# Patient Record
Sex: Female | Born: 1950 | Race: White | Hispanic: No | Smoking: Never smoker
Health system: Southern US, Community
[De-identification: ages and names within clinical notes are randomized; demographics above are authoritative.]

## PROBLEM LIST (undated history)

## (undated) DIAGNOSIS — R519 Headache, unspecified: Secondary | ICD-10-CM

## (undated) DIAGNOSIS — R51 Headache: Secondary | ICD-10-CM

## (undated) DIAGNOSIS — M797 Fibromyalgia: Secondary | ICD-10-CM

## (undated) DIAGNOSIS — I82409 Acute embolism and thrombosis of unspecified deep veins of unspecified lower extremity: Secondary | ICD-10-CM

## (undated) DIAGNOSIS — J309 Allergic rhinitis, unspecified: Secondary | ICD-10-CM

## (undated) DIAGNOSIS — E785 Hyperlipidemia, unspecified: Secondary | ICD-10-CM

## (undated) DIAGNOSIS — I2699 Other pulmonary embolism without acute cor pulmonale: Secondary | ICD-10-CM

## (undated) HISTORY — DX: Acute embolism and thrombosis of unspecified deep veins of unspecified lower extremity: I82.409

## (undated) HISTORY — PX: OTHER SURGICAL HISTORY: SHX169

## (undated) HISTORY — DX: Headache: R51

## (undated) HISTORY — DX: Allergic rhinitis, unspecified: J30.9

## (undated) HISTORY — DX: Other pulmonary embolism without acute cor pulmonale: I26.99

## (undated) HISTORY — DX: Headache, unspecified: R51.9

## (undated) HISTORY — DX: Hyperlipidemia, unspecified: E78.5

## (undated) HISTORY — DX: Fibromyalgia: M79.7

---

## 2016-02-11 DIAGNOSIS — H2513 Age-related nuclear cataract, bilateral: Secondary | ICD-10-CM | POA: Diagnosis not present

## 2016-02-11 DIAGNOSIS — H524 Presbyopia: Secondary | ICD-10-CM | POA: Diagnosis not present

## 2016-02-14 DIAGNOSIS — E785 Hyperlipidemia, unspecified: Secondary | ICD-10-CM | POA: Diagnosis not present

## 2016-02-14 DIAGNOSIS — Z9181 History of falling: Secondary | ICD-10-CM | POA: Diagnosis not present

## 2016-02-14 DIAGNOSIS — J019 Acute sinusitis, unspecified: Secondary | ICD-10-CM | POA: Diagnosis not present

## 2016-02-14 DIAGNOSIS — B359 Dermatophytosis, unspecified: Secondary | ICD-10-CM | POA: Diagnosis not present

## 2016-05-07 DIAGNOSIS — J329 Chronic sinusitis, unspecified: Secondary | ICD-10-CM | POA: Diagnosis not present

## 2016-07-14 DIAGNOSIS — J40 Bronchitis, not specified as acute or chronic: Secondary | ICD-10-CM | POA: Diagnosis not present

## 2016-07-14 DIAGNOSIS — Z6829 Body mass index (BMI) 29.0-29.9, adult: Secondary | ICD-10-CM | POA: Diagnosis not present

## 2016-10-16 DIAGNOSIS — M5416 Radiculopathy, lumbar region: Secondary | ICD-10-CM | POA: Diagnosis not present

## 2016-10-16 DIAGNOSIS — Z6828 Body mass index (BMI) 28.0-28.9, adult: Secondary | ICD-10-CM | POA: Diagnosis not present

## 2016-10-16 DIAGNOSIS — J329 Chronic sinusitis, unspecified: Secondary | ICD-10-CM | POA: Diagnosis not present

## 2016-10-17 DIAGNOSIS — M79604 Pain in right leg: Secondary | ICD-10-CM | POA: Diagnosis not present

## 2016-10-17 DIAGNOSIS — I82442 Acute embolism and thrombosis of left tibial vein: Secondary | ICD-10-CM | POA: Diagnosis not present

## 2016-10-17 DIAGNOSIS — S90511A Abrasion, right ankle, initial encounter: Secondary | ICD-10-CM | POA: Diagnosis not present

## 2016-10-17 DIAGNOSIS — R918 Other nonspecific abnormal finding of lung field: Secondary | ICD-10-CM | POA: Diagnosis not present

## 2016-10-17 DIAGNOSIS — Z79899 Other long term (current) drug therapy: Secondary | ICD-10-CM | POA: Diagnosis not present

## 2016-10-17 DIAGNOSIS — E785 Hyperlipidemia, unspecified: Secondary | ICD-10-CM | POA: Diagnosis not present

## 2016-10-17 DIAGNOSIS — I2699 Other pulmonary embolism without acute cor pulmonale: Secondary | ICD-10-CM | POA: Diagnosis not present

## 2016-10-17 DIAGNOSIS — L03115 Cellulitis of right lower limb: Secondary | ICD-10-CM | POA: Diagnosis not present

## 2016-10-17 DIAGNOSIS — M1990 Unspecified osteoarthritis, unspecified site: Secondary | ICD-10-CM | POA: Diagnosis not present

## 2016-10-17 DIAGNOSIS — I82401 Acute embolism and thrombosis of unspecified deep veins of right lower extremity: Secondary | ICD-10-CM | POA: Diagnosis not present

## 2016-10-17 DIAGNOSIS — M797 Fibromyalgia: Secondary | ICD-10-CM | POA: Diagnosis not present

## 2016-10-17 DIAGNOSIS — Z881 Allergy status to other antibiotic agents status: Secondary | ICD-10-CM | POA: Diagnosis not present

## 2016-10-17 DIAGNOSIS — Z882 Allergy status to sulfonamides status: Secondary | ICD-10-CM | POA: Diagnosis not present

## 2016-10-18 DIAGNOSIS — S90511A Abrasion, right ankle, initial encounter: Secondary | ICD-10-CM | POA: Diagnosis not present

## 2016-10-18 DIAGNOSIS — L03115 Cellulitis of right lower limb: Secondary | ICD-10-CM | POA: Diagnosis not present

## 2016-10-18 DIAGNOSIS — I82401 Acute embolism and thrombosis of unspecified deep veins of right lower extremity: Secondary | ICD-10-CM | POA: Diagnosis not present

## 2016-10-18 DIAGNOSIS — M1990 Unspecified osteoarthritis, unspecified site: Secondary | ICD-10-CM | POA: Diagnosis not present

## 2016-10-18 DIAGNOSIS — R918 Other nonspecific abnormal finding of lung field: Secondary | ICD-10-CM | POA: Diagnosis not present

## 2016-10-18 DIAGNOSIS — I2699 Other pulmonary embolism without acute cor pulmonale: Secondary | ICD-10-CM | POA: Diagnosis not present

## 2016-10-18 DIAGNOSIS — M79604 Pain in right leg: Secondary | ICD-10-CM | POA: Diagnosis not present

## 2016-10-19 DIAGNOSIS — L271 Localized skin eruption due to drugs and medicaments taken internally: Secondary | ICD-10-CM | POA: Diagnosis not present

## 2016-10-19 DIAGNOSIS — T3695XA Adverse effect of unspecified systemic antibiotic, initial encounter: Secondary | ICD-10-CM | POA: Diagnosis not present

## 2016-10-19 DIAGNOSIS — T50905A Adverse effect of unspecified drugs, medicaments and biological substances, initial encounter: Secondary | ICD-10-CM | POA: Diagnosis not present

## 2016-10-21 DIAGNOSIS — I82409 Acute embolism and thrombosis of unspecified deep veins of unspecified lower extremity: Secondary | ICD-10-CM | POA: Diagnosis not present

## 2016-10-21 DIAGNOSIS — I2699 Other pulmonary embolism without acute cor pulmonale: Secondary | ICD-10-CM | POA: Diagnosis not present

## 2016-10-21 DIAGNOSIS — M797 Fibromyalgia: Secondary | ICD-10-CM | POA: Diagnosis not present

## 2016-12-03 DIAGNOSIS — Z23 Encounter for immunization: Secondary | ICD-10-CM | POA: Diagnosis not present

## 2016-12-04 ENCOUNTER — Encounter: Payer: Self-pay | Admitting: Pulmonary Disease

## 2016-12-04 ENCOUNTER — Ambulatory Visit (INDEPENDENT_AMBULATORY_CARE_PROVIDER_SITE_OTHER): Payer: Medicare Other | Admitting: Pulmonary Disease

## 2016-12-04 DIAGNOSIS — R911 Solitary pulmonary nodule: Secondary | ICD-10-CM

## 2016-12-04 NOTE — Patient Instructions (Signed)
We will schedule you for a PET scan for further evaluation of the mass in the lung I will call you after the PET scan to discuss results and further steps as needed  Follow-up in 4 weeks.

## 2016-12-04 NOTE — Progress Notes (Signed)
Rhonda Lane    [id number]    08-Jun-1950  Primary Care Physician:Holt, Gara Kroner, MD  Referring Physician: Ronita Hipps, MD [place] [id number],  Chief complaint:  Consult for evaluation of pulmonary embolism, right lung mass  HPI: 66 year old with history of allergies, headaches, hyperlipidemia, fibromyalgia. She is evaluated at [facility] ED in mid August 2018 for right leg swelling. She is noted to have right leg DVT and bilateral pulmonary embolism. There is no evidence of right heart strain on CT or echocardiogram done at [facility] as per the patient. She is currently on Xarelto anticoagulation. There were no precipitating factors, no travel, immobility. There is no family history of clots.  CT scan of the chest also shows a right lower lobe mass like opacity with mediastinal lymphadenopathy. She has been referred here for further evaluation.  Occupation: Retired Building services engineer Smoking history: Never smoker. Exposed to secondhand smoke Travel History: Trip to [place] in early 2018. No recent travel.  Outpatient Encounter Prescriptions as of 12/04/2016  Medication Sig  . butalbital-acetaminophen-caffeine (FIORICET, ESGIC) 50-325-40 MG tablet TAKE 1 TABLET EVERY 6 HOURS  . cevimeline (EVOXAC) 30 MG capsule Take 30 mg by mouth 3 (three) times daily.  . cyclobenzaprine (FLEXERIL) 10 MG tablet Take 10 mg by mouth 3 (three) times daily as needed for muscle spasms.  Marland Kitchen donepezil (ARICEPT) 5 MG tablet Take 5 mg by mouth at bedtime.  . DULoxetine (CYMBALTA) 60 MG capsule Take 60 mg by mouth daily.  . ergocalciferol (VITAMIN D2) 50000 units capsule Take 50,000 Units by mouth once a week.  Marland Kitchen omeprazole (PRILOSEC) 20 MG capsule Take 20 mg by mouth daily.  . rosuvastatin (CRESTOR) 10 MG tablet Take 10 mg by mouth daily.  . SUPRAX 400 MG CAPS capsule Take 400 mg by mouth daily.  Alveda Reasons STARTER PACK 15 & 20 MG TBPK See admin instructions.   No  facility-administered encounter medications on file as of 12/04/2016.     Allergies as of 12/04/2016 - Review Complete 12/04/2016  Allergen Reaction Noted  . Sulfa antibiotics  12/04/2016  . Erythromycin Rash 12/04/2016  . Macrodantin [nitrofurantoin macrocrystal] Rash 12/04/2016    Past Medical History:  Diagnosis Date  . Allergic rhinitis   . Chronic headache   . DVT (deep venous thrombosis) ([place])   . Fibromyalgia   . Hyperlipidemia   . Pulmonary embolism [facility])     Past Surgical History:  Procedure Laterality Date  . DNC      Family History  Problem Relation Age of Onset  . Heart disease Mother   . Stroke Father   . Throat cancer Paternal Grandfather     Social History   Social History  . Marital status: Widowed    Spouse name: N/A  . Number of children: N/A  . Years of education: N/A   Occupational History  . Not on file.   Social History Main Topics  . Smoking status: Never Smoker  . Smokeless tobacco: Never Used  . Alcohol use No  . Drug use: No  . Sexual activity: Not on file   Other Topics Concern  . Not on file   Social History Narrative  . No narrative on file   Review of systems: Review of Systems  Constitutional: Negative for fever and chills.  HENT: Negative.   Eyes: Negative for blurred vision.  Respiratory: as per HPI  Cardiovascular: Negative for chest pain and palpitations.  Gastrointestinal:  Negative for vomiting, diarrhea, blood per rectum. Genitourinary: Negative for dysuria, urgency, frequency and hematuria.  Musculoskeletal: Negative for myalgias, back pain and joint pain.  Skin: Negative for itching and rash.  Neurological: Negative for dizziness, tremors, focal weakness, seizures and loss of consciousness.  Endo/Heme/Allergies: Negative for environmental allergies.  Psychiatric/Behavioral: Negative for depression, suicidal ideas and hallucinations.  All other systems reviewed and are negative.  Physical Exam: Blood  pressure 126/72, pulse 76, height 5\' 3"  (1.6 m), weight 73.9 kg (163 lb), SpO2 97 %. Gen:      No acute distress HEENT:  EOMI, sclera anicteric Neck:     No masses; no thyromegaly Lungs:    Clear to auscultation bilaterally; normal respiratory effort CV:         Regular rate and rhythm; no murmurs Abd:      + bowel sounds; soft, non-tender; no palpable masses, no distension Ext:    No edema; adequate peripheral perfusion Skin:      Warm and dry; no rash Neuro: alert and oriented x 3 Psych: normal mood and affect  Data Reviewed: CT angiogram of the chest 10/17/16-bilateral lobar and segmental pulmonary embolism, 4 cm right lower lobe masslike opacity, left hilar, subcarinal, pretracheal lymphadenopathy. I reviewed the images personally Lower extremity Doppler 10/17/16-right DVT  Assessment:  Bilateral pulmonary embolism, DVT This is apparently unprovoked. She is currently on Xarelto anticoagulation and may need indefinite therapy. No evidence of RV strain on echocardiogram done at [facility].  Right lung mass with mediastinal lymphadenopathy Could be an infarct but the appearance is concerning for malignancy. Even though she is a nonsmoker she had been exposed to significant secondhand smoke. We will schedule for PET scan for further evaluation We will decide on the need and the best approach to biopsy based on the results of the PET scan  More then 1/2 the time of the 40 min visit was spent in counseling and/or coordination of care with the patient and family.  Plan/Recommendations: - PET/CT of body  Marshell Garfinkel MD  Pulmonary and Critical Care Pager [phone] 12/04/2016, 11:29 AM  CC: Ronita Hipps, MD

## 2016-12-12 ENCOUNTER — Ambulatory Visit (HOSPITAL_COMMUNITY)
Admission: RE | Admit: 2016-12-12 | Discharge: 2016-12-12 | Disposition: A | Discharge status: Home / Self Care | Payer: Medicare Other | Source: Ambulatory Visit | Attending: Pulmonary Disease | Admitting: Pulmonary Disease

## 2016-12-12 DIAGNOSIS — C782 Secondary malignant neoplasm of pleura: Secondary | ICD-10-CM | POA: Insufficient documentation

## 2016-12-12 DIAGNOSIS — C778 Secondary and unspecified malignant neoplasm of lymph nodes of multiple regions: Secondary | ICD-10-CM | POA: Diagnosis not present

## 2016-12-12 DIAGNOSIS — R911 Solitary pulmonary nodule: Secondary | ICD-10-CM | POA: Diagnosis not present

## 2016-12-12 DIAGNOSIS — R918 Other nonspecific abnormal finding of lung field: Secondary | ICD-10-CM | POA: Diagnosis not present

## 2016-12-12 LAB — GLUCOSE, CAPILLARY: Glucose-Capillary: 105 mg/dL — ABNORMAL HIGH (ref 65–99)

## 2016-12-12 IMAGING — PT NM PET TUM IMG INITIAL (PI) SKULL BASE T - THIGH
1 series · 9 of 9 positions shown · non-contrast
Comparison: CT scan 10/17/2016

CLINICAL DATA: Initial treatment strategy for pulmonary nodules.

EXAM:
NUCLEAR MEDICINE PET SKULL BASE TO THIGH
TECHNIQUE: 8.9 mCi F-18 FDG was injected intravenously. Full-ring PET imaging
was performed from the skull base to thigh after the radiotracer. CT
data was obtained and used for attenuation correction and anatomic
localization.
FASTING BLOOD GLUCOSE:  Value: 105 mg/dl

[Series 1032: results mm oncology reading · 0.89mm/px · 9 of 9 slices shown]
[im 1/9]
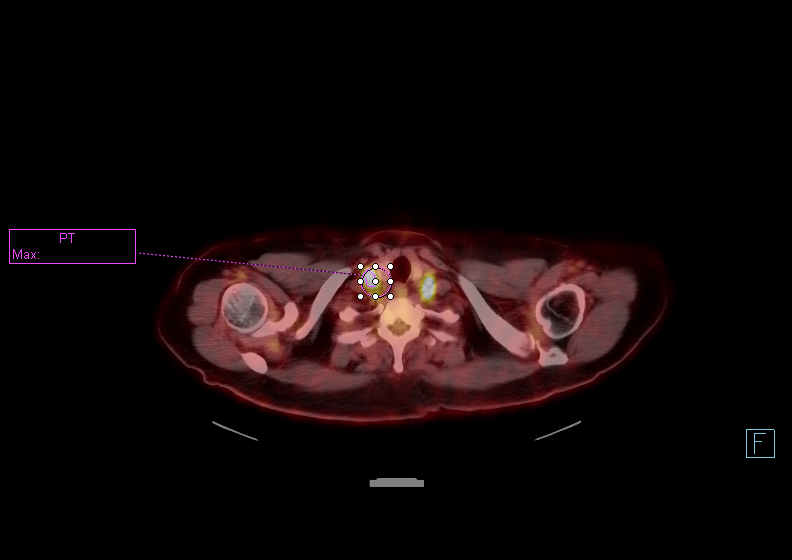
[im 2/9]
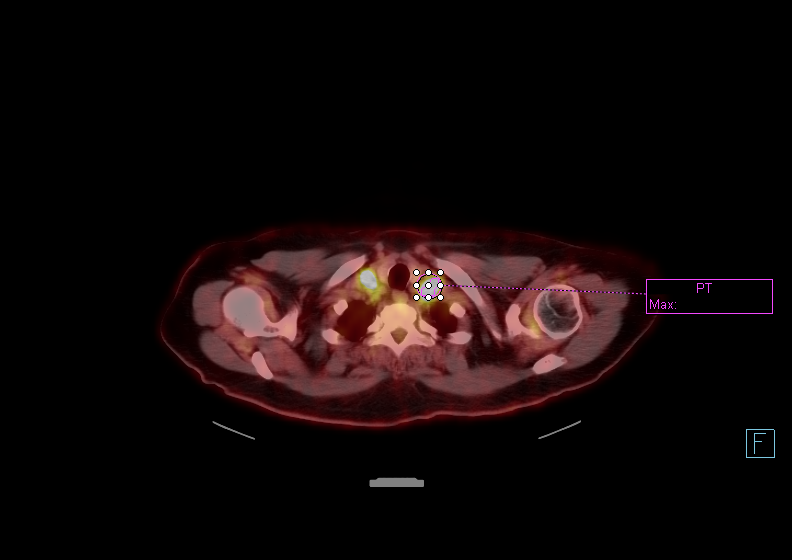
[im 3/9]
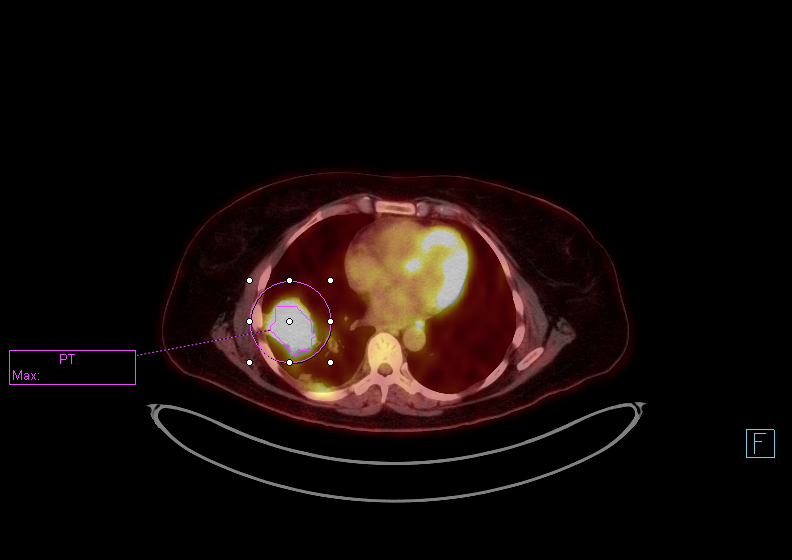
[im 4/9]
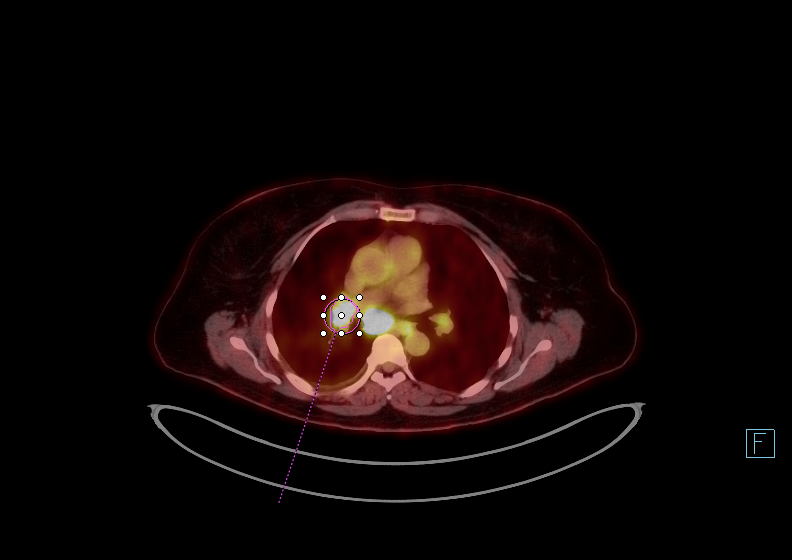
[im 5/9]
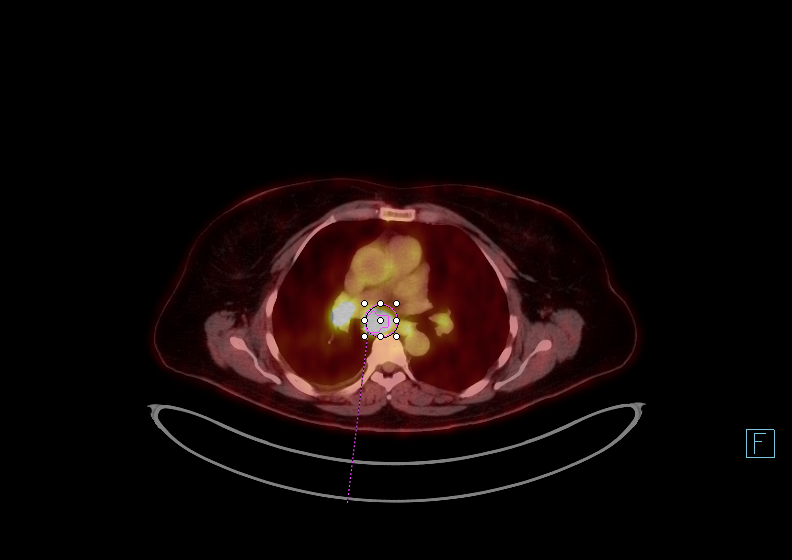
[im 6/9]
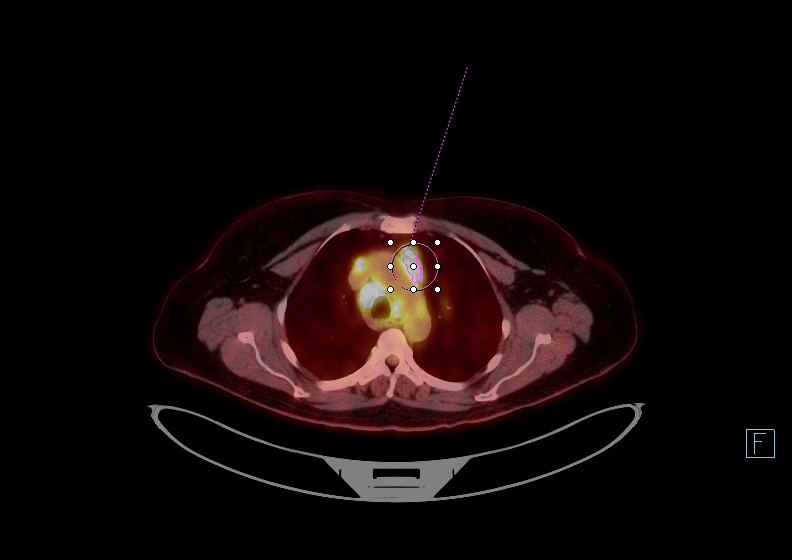
[im 7/9]
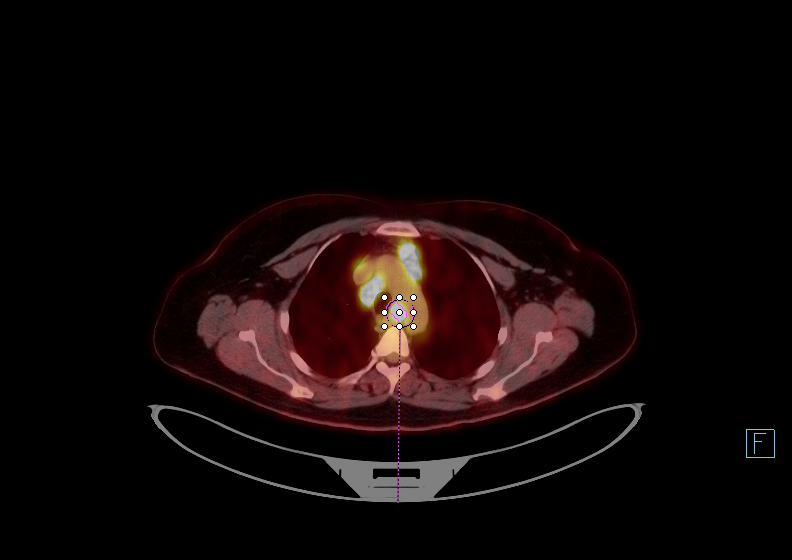
[im 8/9]
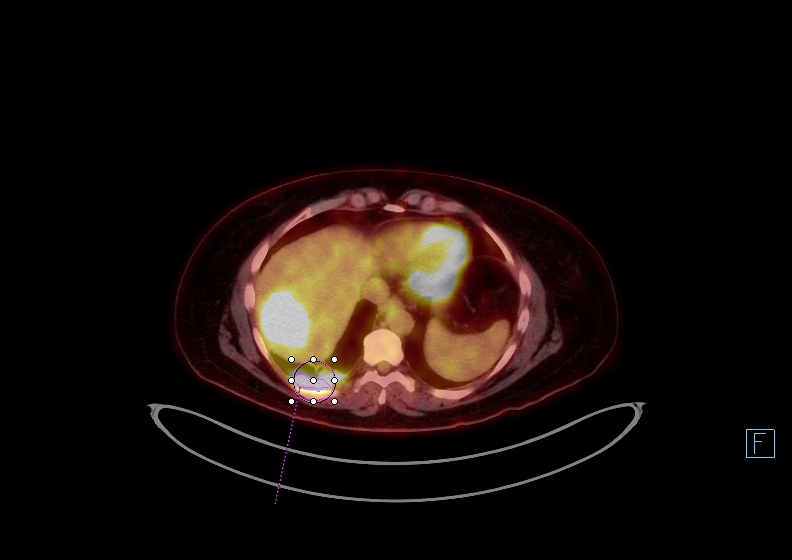
[im 9/9]
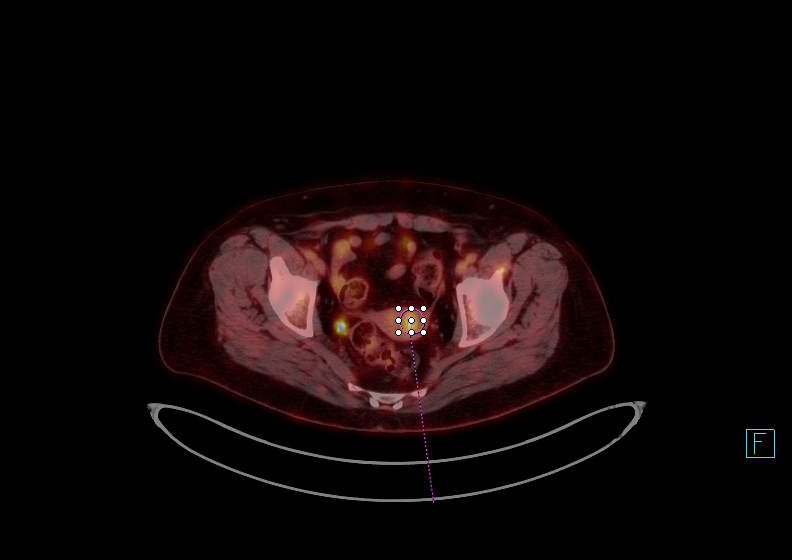

[9 of 9 positions shown; findings below may reference images not displayed]

FINDINGS: NECK: Bilateral hypermetabolic supraclavicular lymphadenopathy. 8 mm
right supraclavicular node on image number 35 has an SUV max of
11 mm node on image number 37 has an SUV max of 13.8. No neck mass
or upper neck adenopathy.

CHEST: Large right lower lobe lung mass measuring 4.5 cm has an SUV
max of

Bulky right hilar and diffuse mediastinal lymphadenopathy is
hypermetabolic.

Right hilar nodal mass has an SUV max of 8.5.

Right basilar pleural disease is hypermetabolic with SUV max of
and consistent with pleural metastasis. Subcarinal nodal disease has
an SUV max of

Prevascular adenopathy has an SUV max of

ABDOMEN/PELVIS: No abnormal hypermetabolic activity within the
liver, pancreas, adrenal glands, or spleen. No hypermetabolic lymph
nodes in the abdomen or pelvis.

The fused PET images make it would. There is a large lesion in the
dome of the liver but this is misregistration with the right lower
lobe lung mass, due to eventration of the right hemidiaphragm.

SKELETON: No focal hypermetabolic activity to suggest skeletal
metastasis.
IMPRESSION: 1. Hypermetabolic right lower lobe lung mass consistent with primary
lung neoplasm.
2. Right hilar and ipsilateral and contralateral mediastinal
metastatic adenopathy.
3. Right pleural metastatic disease.
4. No definite metastatic pulmonary nodules.
5. Bilateral supraclavicular metastatic adenopathy.
6. No abdominal/pelvic or osseous metastatic disease.

## 2016-12-12 MED ORDER — FLUDEOXYGLUCOSE F - 18 (FDG) INJECTION
8.9000 | Freq: Once | INTRAVENOUS | Status: AC | PRN
  Administered 2016-12-12: 8.9 via INTRAVENOUS

## 2016-12-16 ENCOUNTER — Other Ambulatory Visit: Payer: Self-pay | Admitting: Pulmonary Disease

## 2016-12-16 DIAGNOSIS — R590 Localized enlarged lymph nodes: Secondary | ICD-10-CM

## 2016-12-16 DIAGNOSIS — R918 Other nonspecific abnormal finding of lung field: Secondary | ICD-10-CM

## 2016-12-16 NOTE — Progress Notes (Addendum)
US Guided Biopsy per PM Parke Poisson, CMA 12/16/16

## 2016-12-16 NOTE — Addendum Note (Signed)
Addended by: Parke Poisson E on: 12/16/2016 02:31 PM   Modules accepted: Orders

## 2016-12-17 ENCOUNTER — Telehealth: Payer: Self-pay | Admitting: Pulmonary Disease

## 2016-12-17 ENCOUNTER — Other Ambulatory Visit: Payer: Self-pay

## 2016-12-17 DIAGNOSIS — R590 Localized enlarged lymph nodes: Secondary | ICD-10-CM

## 2016-12-17 DIAGNOSIS — R918 Other nonspecific abnormal finding of lung field: Secondary | ICD-10-CM

## 2016-12-17 NOTE — Telephone Encounter (Signed)
Spoke with patient. Advised her that the correct order had to placed and that radiology would call her to get that scheduled.   She verbalized understanding. Nothing else needed at time of call.

## 2016-12-17 NOTE — Telephone Encounter (Signed)
Per patient's chart, this has already been changed. Will close this message.

## 2016-12-26 ENCOUNTER — Other Ambulatory Visit: Payer: Self-pay | Admitting: Radiology

## 2016-12-29 ENCOUNTER — Encounter (HOSPITAL_COMMUNITY): Payer: Self-pay | Admitting: *Deleted

## 2016-12-29 ENCOUNTER — Ambulatory Visit (HOSPITAL_COMMUNITY)
Admission: RE | Admit: 2016-12-29 | Discharge: 2016-12-29 | Disposition: A | Discharge status: Home / Self Care | Payer: Medicare Other | Source: Ambulatory Visit | Attending: Pulmonary Disease | Admitting: Pulmonary Disease

## 2016-12-29 DIAGNOSIS — Z86711 Personal history of pulmonary embolism: Secondary | ICD-10-CM | POA: Diagnosis not present

## 2016-12-29 DIAGNOSIS — C770 Secondary and unspecified malignant neoplasm of lymph nodes of head, face and neck: Secondary | ICD-10-CM | POA: Insufficient documentation

## 2016-12-29 DIAGNOSIS — Z86718 Personal history of other venous thrombosis and embolism: Secondary | ICD-10-CM | POA: Diagnosis not present

## 2016-12-29 DIAGNOSIS — R590 Localized enlarged lymph nodes: Secondary | ICD-10-CM

## 2016-12-29 DIAGNOSIS — C779 Secondary and unspecified malignant neoplasm of lymph node, unspecified: Secondary | ICD-10-CM | POA: Diagnosis not present

## 2016-12-29 DIAGNOSIS — E785 Hyperlipidemia, unspecified: Secondary | ICD-10-CM | POA: Diagnosis not present

## 2016-12-29 DIAGNOSIS — R591 Generalized enlarged lymph nodes: Secondary | ICD-10-CM | POA: Diagnosis present

## 2016-12-29 DIAGNOSIS — M797 Fibromyalgia: Secondary | ICD-10-CM | POA: Diagnosis not present

## 2016-12-29 DIAGNOSIS — R918 Other nonspecific abnormal finding of lung field: Secondary | ICD-10-CM | POA: Insufficient documentation

## 2016-12-29 DIAGNOSIS — C801 Malignant (primary) neoplasm, unspecified: Secondary | ICD-10-CM | POA: Diagnosis not present

## 2016-12-29 DIAGNOSIS — R911 Solitary pulmonary nodule: Secondary | ICD-10-CM | POA: Diagnosis not present

## 2016-12-29 LAB — PROTIME-INR
INR: 1.14
Prothrombin Time: 14.5 seconds (ref 11.4–15.2)

## 2016-12-29 LAB — CBC
HEMATOCRIT: 38.7 % (ref 36.0–46.0)
HEMOGLOBIN: 12.8 g/dL (ref 12.0–15.0)
MCH: 29.6 pg (ref 26.0–34.0)
MCHC: 33.1 g/dL (ref 30.0–36.0)
MCV: 89.4 fL (ref 78.0–100.0)
Platelets: 167 10*3/uL (ref 150–400)
RBC: 4.33 MIL/uL (ref 3.87–5.11)
RDW: 14.2 % (ref 11.5–15.5)
WBC: 6.2 10*3/uL (ref 4.0–10.5)

## 2016-12-29 LAB — APTT: aPTT: 28 seconds (ref 24–36)

## 2016-12-29 MED ORDER — MIDAZOLAM HCL 2 MG/2ML IJ SOLN
INTRAMUSCULAR | Status: AC
  Filled 2016-12-29: qty 2

## 2016-12-29 MED ORDER — GELATIN ABSORBABLE 12-7 MM EX MISC
CUTANEOUS | Status: AC
  Filled 2016-12-29: qty 1

## 2016-12-29 MED ORDER — SODIUM CHLORIDE 0.9 % IV SOLN
INTRAVENOUS | Status: DC
  Administered 2016-12-29: 12:00:00 via INTRAVENOUS

## 2016-12-29 MED ORDER — FENTANYL CITRATE (PF) 100 MCG/2ML IJ SOLN
INTRAMUSCULAR | Status: AC | PRN
  Administered 2016-12-29: 50 ug via INTRAVENOUS

## 2016-12-29 MED ORDER — MIDAZOLAM HCL 2 MG/2ML IJ SOLN
INTRAMUSCULAR | Status: AC | PRN
  Administered 2016-12-29: 1 mg via INTRAVENOUS

## 2016-12-29 MED ORDER — FENTANYL CITRATE (PF) 100 MCG/2ML IJ SOLN
INTRAMUSCULAR | Status: AC
  Filled 2016-12-29: qty 2

## 2016-12-29 MED ORDER — LIDOCAINE HCL (PF) 1 % IJ SOLN
INTRAMUSCULAR | Status: DC
Start: 2016-12-29 — End: 2016-12-30
  Filled 2016-12-29: qty 30

## 2016-12-29 NOTE — Procedures (Signed)
Interventional Radiology Procedure Note  Procedure: US guided right supraclavicular node bx.  .  Complications: None Recommendations:  - Ok to shower tomorrow - Do not submerge for 7 days - Routine wound care  - follow up pathology  Signed,  Dulcy Fanny. Earleen Newport, DO

## 2016-12-29 NOTE — Discharge Instructions (Signed)

## 2016-12-29 NOTE — H&P (Signed)
Chief Complaint: Right lower lobe lung mass with mediastinal and supraclavicular lymphadenopathy  Referring Physician:Dr. Marshell Garfinkel  Supervising Physician: Corrie Mckusick  Patient Status: [facility] - Out-pt  HPI: Rhonda Lane is a 66 y.o. female with a history of fibromyalgia, and right lower extremity DVT and bilateral pulmonary emboli found in August 2018 at [facility] emergency department.  On that CT scan she was also noted to have a right lower lobe mass with mediastinal lymphadenopathy.  She has been evaluated by a pulmonology doctor.  A PET scan has been obtained.  This revealed a hypermetabolic right lower lobe lung mass consistent with primary lung neoplasm as well as mediastinal adenopathy, right pleural metastatic disease, bilateral supraclavicular metastatic adenopathy.  The patient presents today for a biopsy of a supraclavicular lymph node for diagnosis.  Past Medical History:  Past Medical History:  Diagnosis Date  . Allergic rhinitis   . Chronic headache   . DVT (deep venous thrombosis) ([place])   . Fibromyalgia   . Hyperlipidemia   . Pulmonary embolism [facility])     Past Surgical History:  Past Surgical History:  Procedure Laterality Date  . DNC      Family History:  Family History  Problem Relation Age of Onset  . Heart disease Mother   . Stroke Father   . Throat cancer Paternal Grandfather     Social History:  reports that she has never smoked. She has never used smokeless tobacco. She reports that she does not drink alcohol or use drugs.  Allergies:  Allergies  Allergen Reactions  . Simvastatin Other (See Comments)    Depression  . Sulfa Antibiotics Other (See Comments)    Unknown  . Tizanidine Other (See Comments)    BP too low  . Erythromycin Rash  . Macrodantin [Nitrofurantoin Macrocrystal] Rash    Medications: Medications reviewed in epic  Please HPI for pertinent positives, otherwise complete 10 system ROS negative, except some right lower  chest wall pain with deep inspiration.  Mallampati Score: MD Evaluation Airway: WNL Heart: WNL Abdomen: WNL Chest/ Lungs: WNL ASA  Classification: 3 Mallampati/Airway Score: Two  Physical Exam: BP (!) 152/80   Pulse 88   Temp 97.7 F (36.5 C) (Oral)   Ht 5\' 3"  (1.6 m)   Wt 160 lb (72.6 kg)   SpO2 100%   BMI 28.34 kg/m  Body mass index is 28.34 kg/m. General: pleasant, WD, WN white female who is laying in bed in NAD HEENT: head is normocephalic, atraumatic.  Sclera are noninjected.  PERRL.  Ears and nose without any masses or lesions.  Mouth is pink and moist Heart: regular, rate, and rhythm.  Normal s1,s2. No obvious murmurs, gallops, or rubs noted.  Palpable radial and pedal pulses bilaterally Lungs: CTAB, no wheezes, rhonchi, or rales noted.  Respiratory effort nonlabored Abd: soft, NT, ND, +BS, no masses, hernias, or organomegaly Psych: A&Ox3 with an appropriate affect.   Labs: Results for orders placed or performed during the hospital encounter of 12/29/16 (from the past 48 hour(s))  APTT upon arrival     Status: None   Collection Time: 12/29/16 12:00 PM  Result Value Ref Range   aPTT 28 24 - 36 seconds  CBC upon arrival     Status: None   Collection Time: 12/29/16 12:00 PM  Result Value Ref Range   WBC 6.2 4.0 - 10.5 K/uL   RBC 4.33 3.87 - 5.11 MIL/uL   Hemoglobin 12.8 12.0 - 15.0 g/dL  HCT 38.7 36.0 - 46.0 %   MCV 89.4 78.0 - 100.0 fL   MCH 29.6 26.0 - 34.0 pg   MCHC 33.1 30.0 - 36.0 g/dL   RDW 14.2 11.5 - 15.5 %   Platelets 167 150 - 400 K/uL  Protime-INR upon arrival     Status: None   Collection Time: 12/29/16 12:00 PM  Result Value Ref Range   Prothrombin Time 14.5 11.4 - 15.2 seconds   INR 1.14     Imaging: No results found.  Assessment/Plan 1.  Bilateral supraclavicular lymphadenopathy  We will proceed with a supraclavicular lymph node biopsy using ultrasound guidance.  The patient has had her Xarelto held for over 24 hours.  Her labs and  vital signs have all been reviewed. Risks and benefits discussed with the patient including, but not limited to bleeding, infection, damage to adjacent structures or low yield requiring additional tests. All of the patient's questions were answered, patient is agreeable to proceed. Consent signed and in chart.    Thank you for this interesting consult.  I greatly enjoyed meeting Rhonda Lane and look forward to participating in their care.  A copy of this report was sent to the requesting provider on this date.  Electronically Signed: Henreitta Cea 12/29/2016, 12:53 PM   I spent a total of  30 Minutes   in face to face in clinical consultation, greater than 50% of which was counseling/coordinating care for supraclavicular lymphadenopathy

## 2016-12-29 NOTE — Sedation Documentation (Signed)
Patient is resting comfortably. 

## 2016-12-31 ENCOUNTER — Other Ambulatory Visit: Payer: Self-pay

## 2016-12-31 ENCOUNTER — Telehealth: Payer: Self-pay | Admitting: Pulmonary Disease

## 2016-12-31 DIAGNOSIS — C3491 Malignant neoplasm of unspecified part of right bronchus or lung: Secondary | ICD-10-CM

## 2016-12-31 NOTE — Telephone Encounter (Signed)
Spoke with pt, she states she would like to know what type of cancer she has, hodkins, leukemia? She states she has an appt with PM tomorrow so I suggested she talk to him in detail tomorrow at the [place]. Pt agreed and nothing further is needed.

## 2017-01-01 ENCOUNTER — Encounter: Payer: Self-pay | Admitting: Pulmonary Disease

## 2017-01-01 ENCOUNTER — Ambulatory Visit (INDEPENDENT_AMBULATORY_CARE_PROVIDER_SITE_OTHER): Payer: Medicare Other | Admitting: Pulmonary Disease

## 2017-01-01 ENCOUNTER — Ambulatory Visit: Admitting: Pulmonary Disease

## 2017-01-01 VITALS — BP 136/78 | HR 99 | Ht 63.0 in | Wt 160.2 lb

## 2017-01-01 DIAGNOSIS — C3490 Malignant neoplasm of unspecified part of unspecified bronchus or lung: Secondary | ICD-10-CM | POA: Insufficient documentation

## 2017-01-01 NOTE — Progress Notes (Addendum)
Rhonda Lane    [id number]    Apr 10, 1950  Primary Care Physician:Holt, Gara Kroner, MD  Referring Physician: Ronita Hipps, MD [place] [address],  Chief complaint:  Follow up for Lung cancer, metastatic adenocarcinoma Pulmonary embolism, DVT  HPI: 66 year old with history of allergies, headaches, hyperlipidemia, fibromyalgia. She is evaluated at [facility] ED in mid August 2018 for right leg swelling. She is noted to have right leg DVT and bilateral pulmonary embolism. There is no evidence of right heart strain on CT or echocardiogram done at [facility] as per the patient. She is currently on Xarelto anticoagulation. There were no precipitating factors, no travel, immobility. There is no family history of clots.  CT scan of the chest also shows a right lower lobe mass like opacity with mediastinal lymphadenopathy. She has been referred here for further evaluation.  Occupation: Retired Building services engineer Smoking history: Never smoker. Exposed to secondhand smoke Travel History: Trip to [place] in early 2018. No recent travel.  Interim history: She underwent lymph node biopsy which shows metastatic adenocarcinoma.  She has developed a rash around the area of the biopsy site likely secondary to chlorhexidine.  She is treating this with cortisone ointment.  Outpatient Encounter Prescriptions as of 01/01/2017  Medication Sig  . butalbital-acetaminophen-caffeine (FIORICET, ESGIC) 50-325-40 MG tablet Take 2 tablet by mouth every 6 hours as needed for headaches  . Calcium Carb-Cholecalciferol (CALCIUM 600+D) 600-800 MG-UNIT TABS Take 1 tablet by mouth at bedtime.  . cevimeline (EVOXAC) 30 MG capsule Take 30 mg by mouth 3 (three) times daily.  . Cholecalciferol (HM VITAMIN D3) 2000 units CAPS Take 2,000 Units by mouth at bedtime.  . cyclobenzaprine (FLEXERIL) 10 MG tablet Take 10-20 mg by mouth See admin instructions. Take 10 mg by mouth in the morning and take 20 mg  by mouth at bedtime  . donepezil (ARICEPT) 5 MG tablet Take 5 mg by mouth daily.   . DULoxetine (CYMBALTA) 60 MG capsule Take 60 mg by mouth 2 (two) times daily.   . ergocalciferol (VITAMIN D2) 50000 units capsule Take 50,000 Units by mouth every 14 (fourteen) days.   . magnesium gluconate (MAGONATE) 500 MG tablet Take 500 mg by mouth at bedtime.  . Multiple Vitamins-Minerals (MULTIVITAMIN PO) Take 1 tablet by mouth daily.  . Omega-3 Fatty Acids (OMEGA-3 FISH OIL PO) Take 1 capsule by mouth at bedtime.  Marland Kitchen omeprazole (PRILOSEC) 20 MG capsule Take 20 mg by mouth daily.  Vladimir Faster Glycol-Propyl Glycol (SYSTANE OP) Place 1 drop into both eyes See admin instructions. Place 1 drop in both eyes twice daily. Place 1 drop in both eyes as needed for dry eyes  . rivaroxaban (XARELTO) 20 MG TABS tablet Take 20 mg by mouth daily.   No facility-administered encounter medications on file as of 01/01/2017.     Allergies as of 01/01/2017 - Review Complete 12/29/2016  Allergen Reaction Noted  . Simvastatin Other (See Comments) 12/22/2016  . Sulfa antibiotics Other (See Comments) 12/04/2016  . Tizanidine Other (See Comments) 12/22/2016  . Erythromycin Rash 12/04/2016  . Macrodantin [nitrofurantoin macrocrystal] Rash 12/04/2016    Past Medical History:  Diagnosis Date  . Allergic rhinitis   . Chronic headache   . DVT (deep venous thrombosis) ([place])   . Fibromyalgia   . Hyperlipidemia   . Pulmonary embolism [facility])     Past Surgical History:  Procedure Laterality Date  . [facility]      Family  History  Problem Relation Age of Onset  . Heart disease Mother   . Stroke Father   . Throat cancer Paternal Grandfather     Social History   Social History  . Marital status: Widowed    Spouse name: N/A  . Number of children: N/A  . Years of education: N/A   Occupational History  . Not on file.   Social History Main Topics  . Smoking status: Never Smoker  . Smokeless tobacco: Never Used  . Alcohol  use No  . Drug use: No  . Sexual activity: Not on file   Other Topics Concern  . Not on file   Social History Narrative  . No narrative on file   Review of systems: Review of Systems  Constitutional: Negative for fever and chills.  HENT: Negative.   Eyes: Negative for blurred vision.  Respiratory: as per HPI  Cardiovascular: Negative for chest pain and palpitations.  Gastrointestinal: Negative for vomiting, diarrhea, blood per rectum. Genitourinary: Negative for dysuria, urgency, frequency and hematuria.  Musculoskeletal: Negative for myalgias, back pain and joint pain.  Skin: Negative for itching and rash.  Neurological: Negative for dizziness, tremors, focal weakness, seizures and loss of consciousness.  Endo/Heme/Allergies: Negative for environmental allergies.  Psychiatric/Behavioral: Negative for depression, suicidal ideas and hallucinations.  All other systems reviewed and are negative.  Physical Exam: Blood pressure 136/78, pulse 99, height 5\' 3"  (1.6 m), weight 160 lb 3.2 oz (72.7 kg), SpO2 98 %. Gen:      No acute distress HEENT:  EOMI, sclera anicteric Neck:     No masses; no thyromegaly Lungs:    Clear to auscultation bilaterally; normal respiratory effort CV:         Regular rate and rhythm; no murmurs Abd:      + bowel sounds; soft, non-tender; no palpable masses, no distension Ext:    No edema; adequate peripheral perfusion Skin:      Warm and dry; no rash Neuro: alert and oriented x 3 Psych: normal mood and affect  Data Reviewed: CT angiogram of the chest 10/17/16-bilateral lobar and segmental pulmonary embolism, 4 cm right lower lobe masslike opacity, left hilar, subcarinal, pretracheal lymphadenopathy. I reviewed the images personally Lower extremity Doppler 10/17/16-right DVT PET scan 12/12/16-FDG avid right lower lobe lung mass with involvement of the pleura, mediastinal lymphadenopathy, bilateral supraclavicular lymphadenopathy. Have reviewed all images  personally  Supraclavicular lymph node biopsy 12/29/16-adenocarcinoma  Assessment:  Metastatic adenocarcinoma PET scan reviewed which showed avid lung mass with mediastinal and supraclavicular lymphadenopathy.  She underwent a lymph node biopsy which confirms adenocarcinoma. Discussed results with patient and her husband today and answered all questions regarding her diagnosis and further steps She is requesting a referral to Dr. Hinton Rao, oncologist at [facility].  Bilateral pulmonary embolism, DVT On Xarelto.  She will need indefinite anticoagulation.  No evidence of RV strain on echocardiogram done at [facility].  Rash secondary to chlorhexidine Continue cortisone ointment, Eucerin cream. Use Benadryl over-the-counter. .  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 Started on palliative  chemotherapy with pembrolizumab She is having increasing cough with concern for immune mediated pneumonitis secondary to chemotherapy versus infection  Pembrolizimab was held and she was given a course of levofloxacin.   Marshell Garfinkel MD [place] Pulmonary and Critical Care Pager [phone] If no answer or after 3pm call: [phone] 04/01/2017, 9:11 AM

## 2017-01-01 NOTE — Patient Instructions (Signed)
We have discussed results of the biopsy which shows adenocarcinoma Referral has been placed to Dr. Hinton Rao, oncologist at [place] in 6 months.

## 2017-01-02 ENCOUNTER — Ambulatory Visit: Admitting: Pulmonary Disease

## 2017-01-05 DIAGNOSIS — L309 Dermatitis, unspecified: Secondary | ICD-10-CM | POA: Diagnosis not present

## 2017-01-05 DIAGNOSIS — L299 Pruritus, unspecified: Secondary | ICD-10-CM | POA: Diagnosis not present

## 2017-01-05 DIAGNOSIS — L820 Inflamed seborrheic keratosis: Secondary | ICD-10-CM | POA: Diagnosis not present

## 2017-01-07 DIAGNOSIS — C770 Secondary and unspecified malignant neoplasm of lymph nodes of head, face and neck: Secondary | ICD-10-CM | POA: Diagnosis not present

## 2017-01-07 DIAGNOSIS — Z86711 Personal history of pulmonary embolism: Secondary | ICD-10-CM | POA: Diagnosis not present

## 2017-01-07 DIAGNOSIS — Z86718 Personal history of other venous thrombosis and embolism: Secondary | ICD-10-CM | POA: Diagnosis not present

## 2017-01-07 DIAGNOSIS — C3491 Malignant neoplasm of unspecified part of right bronchus or lung: Secondary | ICD-10-CM | POA: Diagnosis not present

## 2017-01-07 DIAGNOSIS — Z7901 Long term (current) use of anticoagulants: Secondary | ICD-10-CM | POA: Diagnosis not present

## 2017-01-07 DIAGNOSIS — M797 Fibromyalgia: Secondary | ICD-10-CM | POA: Diagnosis not present

## 2017-01-07 DIAGNOSIS — C3431 Malignant neoplasm of lower lobe, right bronchus or lung: Secondary | ICD-10-CM | POA: Diagnosis not present

## 2017-01-13 DIAGNOSIS — C3431 Malignant neoplasm of lower lobe, right bronchus or lung: Secondary | ICD-10-CM | POA: Diagnosis not present

## 2017-01-13 DIAGNOSIS — I639 Cerebral infarction, unspecified: Secondary | ICD-10-CM | POA: Diagnosis not present

## 2017-01-16 ENCOUNTER — Encounter (HOSPITAL_COMMUNITY): Payer: Self-pay

## 2017-01-16 DIAGNOSIS — D1801 Hemangioma of skin and subcutaneous tissue: Secondary | ICD-10-CM | POA: Diagnosis not present

## 2017-01-16 DIAGNOSIS — L814 Other melanin hyperpigmentation: Secondary | ICD-10-CM | POA: Diagnosis not present

## 2017-01-16 DIAGNOSIS — L820 Inflamed seborrheic keratosis: Secondary | ICD-10-CM | POA: Diagnosis not present

## 2017-01-16 DIAGNOSIS — L578 Other skin changes due to chronic exposure to nonionizing radiation: Secondary | ICD-10-CM | POA: Diagnosis not present

## 2017-01-21 ENCOUNTER — Encounter (HOSPITAL_COMMUNITY): Payer: Self-pay

## 2017-01-21 DIAGNOSIS — C770 Secondary and unspecified malignant neoplasm of lymph nodes of head, face and neck: Secondary | ICD-10-CM | POA: Diagnosis not present

## 2017-01-21 DIAGNOSIS — C3431 Malignant neoplasm of lower lobe, right bronchus or lung: Secondary | ICD-10-CM | POA: Diagnosis not present

## 2017-01-29 DIAGNOSIS — Z5111 Encounter for antineoplastic chemotherapy: Secondary | ICD-10-CM | POA: Diagnosis not present

## 2017-01-29 DIAGNOSIS — C3431 Malignant neoplasm of lower lobe, right bronchus or lung: Secondary | ICD-10-CM | POA: Diagnosis not present

## 2017-02-06 DIAGNOSIS — C3431 Malignant neoplasm of lower lobe, right bronchus or lung: Secondary | ICD-10-CM | POA: Diagnosis not present

## 2017-02-06 DIAGNOSIS — J329 Chronic sinusitis, unspecified: Secondary | ICD-10-CM | POA: Diagnosis not present

## 2017-02-06 DIAGNOSIS — C770 Secondary and unspecified malignant neoplasm of lymph nodes of head, face and neck: Secondary | ICD-10-CM | POA: Diagnosis not present

## 2017-02-13 DIAGNOSIS — R9389 Abnormal findings on diagnostic imaging of other specified body structures: Secondary | ICD-10-CM | POA: Diagnosis not present

## 2017-02-13 DIAGNOSIS — C3431 Malignant neoplasm of lower lobe, right bronchus or lung: Secondary | ICD-10-CM | POA: Diagnosis not present

## 2017-02-13 DIAGNOSIS — I359 Nonrheumatic aortic valve disorder, unspecified: Secondary | ICD-10-CM | POA: Diagnosis not present

## 2017-02-19 DIAGNOSIS — C3431 Malignant neoplasm of lower lobe, right bronchus or lung: Secondary | ICD-10-CM | POA: Diagnosis not present

## 2017-02-19 DIAGNOSIS — R05 Cough: Secondary | ICD-10-CM | POA: Diagnosis not present

## 2017-02-26 DIAGNOSIS — R05 Cough: Secondary | ICD-10-CM | POA: Diagnosis not present

## 2017-02-26 DIAGNOSIS — C3431 Malignant neoplasm of lower lobe, right bronchus or lung: Secondary | ICD-10-CM | POA: Diagnosis not present

## 2017-02-26 DIAGNOSIS — R0602 Shortness of breath: Secondary | ICD-10-CM | POA: Diagnosis not present

## 2017-02-26 DIAGNOSIS — D649 Anemia, unspecified: Secondary | ICD-10-CM | POA: Diagnosis not present

## 2017-03-05 DIAGNOSIS — R0602 Shortness of breath: Secondary | ICD-10-CM | POA: Diagnosis not present

## 2017-03-05 DIAGNOSIS — R05 Cough: Secondary | ICD-10-CM | POA: Diagnosis not present

## 2017-03-05 DIAGNOSIS — R100 Acute abdomen: Secondary | ICD-10-CM | POA: Diagnosis not present

## 2017-03-05 DIAGNOSIS — C3431 Malignant neoplasm of lower lobe, right bronchus or lung: Secondary | ICD-10-CM | POA: Diagnosis not present

## 2017-03-06 DIAGNOSIS — R918 Other nonspecific abnormal finding of lung field: Secondary | ICD-10-CM | POA: Diagnosis not present

## 2017-03-06 DIAGNOSIS — R0602 Shortness of breath: Secondary | ICD-10-CM | POA: Diagnosis not present

## 2017-03-06 DIAGNOSIS — R531 Weakness: Secondary | ICD-10-CM | POA: Diagnosis not present

## 2017-03-06 DIAGNOSIS — R109 Unspecified abdominal pain: Secondary | ICD-10-CM | POA: Diagnosis not present

## 2017-03-06 DIAGNOSIS — C3491 Malignant neoplasm of unspecified part of right bronchus or lung: Secondary | ICD-10-CM | POA: Diagnosis not present

## 2017-03-06 DIAGNOSIS — R51 Headache: Secondary | ICD-10-CM | POA: Diagnosis not present

## 2017-03-09 DIAGNOSIS — Z86711 Personal history of pulmonary embolism: Secondary | ICD-10-CM | POA: Diagnosis not present

## 2017-03-09 DIAGNOSIS — Z66 Do not resuscitate: Secondary | ICD-10-CM | POA: Diagnosis not present

## 2017-03-09 DIAGNOSIS — I639 Cerebral infarction, unspecified: Secondary | ICD-10-CM | POA: Diagnosis not present

## 2017-03-09 DIAGNOSIS — C3490 Malignant neoplasm of unspecified part of unspecified bronchus or lung: Secondary | ICD-10-CM | POA: Diagnosis not present

## 2017-03-09 DIAGNOSIS — I2609 Other pulmonary embolism with acute cor pulmonale: Secondary | ICD-10-CM | POA: Diagnosis present

## 2017-03-09 DIAGNOSIS — Z8719 Personal history of other diseases of the digestive system: Secondary | ICD-10-CM | POA: Diagnosis not present

## 2017-03-09 DIAGNOSIS — I2699 Other pulmonary embolism without acute cor pulmonale: Secondary | ICD-10-CM | POA: Diagnosis not present

## 2017-03-09 DIAGNOSIS — M7989 Other specified soft tissue disorders: Secondary | ICD-10-CM | POA: Diagnosis not present

## 2017-03-09 DIAGNOSIS — Q211 Atrial septal defect: Secondary | ICD-10-CM | POA: Diagnosis not present

## 2017-03-09 DIAGNOSIS — I340 Nonrheumatic mitral (valve) insufficiency: Secondary | ICD-10-CM | POA: Diagnosis not present

## 2017-03-09 DIAGNOSIS — K7589 Other specified inflammatory liver diseases: Secondary | ICD-10-CM | POA: Diagnosis not present

## 2017-03-09 DIAGNOSIS — R279 Unspecified lack of coordination: Secondary | ICD-10-CM | POA: Diagnosis not present

## 2017-03-09 DIAGNOSIS — Z7901 Long term (current) use of anticoagulants: Secondary | ICD-10-CM | POA: Diagnosis not present

## 2017-03-09 DIAGNOSIS — I6310 Cerebral infarction due to embolism of unspecified precerebral artery: Secondary | ICD-10-CM | POA: Diagnosis not present

## 2017-03-09 DIAGNOSIS — Z87898 Personal history of other specified conditions: Secondary | ICD-10-CM | POA: Diagnosis not present

## 2017-03-09 DIAGNOSIS — C3431 Malignant neoplasm of lower lobe, right bronchus or lung: Secondary | ICD-10-CM | POA: Diagnosis not present

## 2017-03-09 DIAGNOSIS — F419 Anxiety disorder, unspecified: Secondary | ICD-10-CM | POA: Diagnosis present

## 2017-03-09 DIAGNOSIS — Z8739 Personal history of other diseases of the musculoskeletal system and connective tissue: Secondary | ICD-10-CM | POA: Diagnosis not present

## 2017-03-09 DIAGNOSIS — I517 Cardiomegaly: Secondary | ICD-10-CM | POA: Diagnosis not present

## 2017-03-09 DIAGNOSIS — R443 Hallucinations, unspecified: Secondary | ICD-10-CM | POA: Diagnosis not present

## 2017-03-09 DIAGNOSIS — I341 Nonrheumatic mitral (valve) prolapse: Secondary | ICD-10-CM | POA: Diagnosis not present

## 2017-03-09 DIAGNOSIS — R945 Abnormal results of liver function studies: Secondary | ICD-10-CM | POA: Diagnosis not present

## 2017-03-09 DIAGNOSIS — R300 Dysuria: Secondary | ICD-10-CM | POA: Diagnosis present

## 2017-03-09 DIAGNOSIS — C3491 Malignant neoplasm of unspecified part of right bronchus or lung: Secondary | ICD-10-CM | POA: Diagnosis not present

## 2017-03-09 DIAGNOSIS — R930 Abnormal findings on diagnostic imaging of skull and head, not elsewhere classified: Secondary | ICD-10-CM | POA: Diagnosis not present

## 2017-03-09 DIAGNOSIS — I2690 Septic pulmonary embolism without acute cor pulmonale: Secondary | ICD-10-CM | POA: Diagnosis not present

## 2017-03-09 DIAGNOSIS — K219 Gastro-esophageal reflux disease without esophagitis: Secondary | ICD-10-CM | POA: Diagnosis present

## 2017-03-09 DIAGNOSIS — G8929 Other chronic pain: Secondary | ICD-10-CM | POA: Diagnosis present

## 2017-03-09 DIAGNOSIS — Z79899 Other long term (current) drug therapy: Secondary | ICD-10-CM | POA: Diagnosis not present

## 2017-03-09 DIAGNOSIS — M797 Fibromyalgia: Secondary | ICD-10-CM | POA: Diagnosis not present

## 2017-03-09 DIAGNOSIS — T451X5A Adverse effect of antineoplastic and immunosuppressive drugs, initial encounter: Secondary | ICD-10-CM | POA: Diagnosis present

## 2017-03-09 DIAGNOSIS — G9341 Metabolic encephalopathy: Secondary | ICD-10-CM | POA: Diagnosis not present

## 2017-03-09 DIAGNOSIS — D649 Anemia, unspecified: Secondary | ICD-10-CM | POA: Diagnosis not present

## 2017-03-09 DIAGNOSIS — Z7401 Bed confinement status: Secondary | ICD-10-CM | POA: Diagnosis not present

## 2017-03-09 DIAGNOSIS — Z86718 Personal history of other venous thrombosis and embolism: Secondary | ICD-10-CM | POA: Diagnosis not present

## 2017-03-09 DIAGNOSIS — R4182 Altered mental status, unspecified: Secondary | ICD-10-CM | POA: Diagnosis not present

## 2017-03-09 DIAGNOSIS — Z8679 Personal history of other diseases of the circulatory system: Secondary | ICD-10-CM | POA: Diagnosis not present

## 2017-03-09 DIAGNOSIS — R0902 Hypoxemia: Secondary | ICD-10-CM | POA: Diagnosis present

## 2017-03-09 DIAGNOSIS — Z8639 Personal history of other endocrine, nutritional and metabolic disease: Secondary | ICD-10-CM | POA: Diagnosis not present

## 2017-03-09 DIAGNOSIS — Z515 Encounter for palliative care: Secondary | ICD-10-CM | POA: Diagnosis not present

## 2017-03-10 DIAGNOSIS — K7589 Other specified inflammatory liver diseases: Secondary | ICD-10-CM

## 2017-03-10 DIAGNOSIS — I341 Nonrheumatic mitral (valve) prolapse: Secondary | ICD-10-CM

## 2017-03-10 DIAGNOSIS — Z86711 Personal history of pulmonary embolism: Secondary | ICD-10-CM

## 2017-03-10 DIAGNOSIS — I2699 Other pulmonary embolism without acute cor pulmonale: Secondary | ICD-10-CM

## 2017-03-10 DIAGNOSIS — C3490 Malignant neoplasm of unspecified part of unspecified bronchus or lung: Secondary | ICD-10-CM

## 2017-03-10 DIAGNOSIS — C3431 Malignant neoplasm of lower lobe, right bronchus or lung: Secondary | ICD-10-CM

## 2017-03-10 DIAGNOSIS — M797 Fibromyalgia: Secondary | ICD-10-CM

## 2017-03-10 DIAGNOSIS — R4182 Altered mental status, unspecified: Secondary | ICD-10-CM

## 2017-03-10 DIAGNOSIS — I517 Cardiomegaly: Secondary | ICD-10-CM

## 2017-03-10 DIAGNOSIS — Z86718 Personal history of other venous thrombosis and embolism: Secondary | ICD-10-CM

## 2017-03-13 DIAGNOSIS — Z86718 Personal history of other venous thrombosis and embolism: Secondary | ICD-10-CM

## 2017-03-13 DIAGNOSIS — I2699 Other pulmonary embolism without acute cor pulmonale: Secondary | ICD-10-CM | POA: Diagnosis not present

## 2017-03-13 DIAGNOSIS — Z86711 Personal history of pulmonary embolism: Secondary | ICD-10-CM | POA: Diagnosis not present

## 2017-03-13 DIAGNOSIS — R0902 Hypoxemia: Secondary | ICD-10-CM | POA: Diagnosis not present

## 2017-04-03 DEATH — deceased
